# Patient Record
Sex: Female | Born: 1995 | Race: White | Hispanic: No | Marital: Married
Health system: Southern US, Community
[De-identification: ages and names within clinical notes are randomized; demographics above are authoritative.]

## PROBLEM LIST (undated history)

## (undated) DIAGNOSIS — F419 Anxiety disorder, unspecified: Secondary | ICD-10-CM

## (undated) DIAGNOSIS — K219 Gastro-esophageal reflux disease without esophagitis: Secondary | ICD-10-CM

---

## 2019-10-10 ENCOUNTER — Emergency Department (HOSPITAL_COMMUNITY): Payer: No Typology Code available for payment source

## 2019-10-10 ENCOUNTER — Other Ambulatory Visit: Payer: Self-pay

## 2019-10-10 ENCOUNTER — Encounter (HOSPITAL_COMMUNITY): Payer: Self-pay | Admitting: Emergency Medicine

## 2019-10-10 ENCOUNTER — Emergency Department (HOSPITAL_COMMUNITY)
Admission: EM | Admit: 2019-10-10 | Discharge: 2019-10-11 | Disposition: A | Discharge status: Home / Self Care | Payer: No Typology Code available for payment source | Attending: Emergency Medicine | Admitting: Emergency Medicine

## 2019-10-10 DIAGNOSIS — F419 Anxiety disorder, unspecified: Secondary | ICD-10-CM | POA: Diagnosis not present

## 2019-10-10 DIAGNOSIS — R110 Nausea: Secondary | ICD-10-CM | POA: Diagnosis not present

## 2019-10-10 DIAGNOSIS — R000 Tachycardia, unspecified: Secondary | ICD-10-CM | POA: Diagnosis not present

## 2019-10-10 HISTORY — DX: Anxiety disorder, unspecified: F41.9

## 2019-10-10 HISTORY — DX: Gastro-esophageal reflux disease without esophagitis: K21.9

## 2019-10-10 LAB — BASIC METABOLIC PANEL
Anion gap: 10 (ref 5–15)
BUN: 8 mg/dL (ref 6–20)
CO2: 23 mmol/L (ref 22–32)
Calcium: 9.7 mg/dL (ref 8.9–10.3)
Chloride: 104 mmol/L (ref 98–111)
Creatinine, Ser: 0.9 mg/dL (ref 0.44–1.00)
GFR calc Af Amer: >60 mL/min (ref >60)
GFR calc non Af Amer: >60 mL/min (ref >60)
Glucose, Bld: 146 mg/dL — ABNORMAL HIGH (ref 70–99)
Potassium: 3.2 mmol/L — ABNORMAL LOW (ref 3.5–5.1)
Sodium: 137 mmol/L (ref 135–145)

## 2019-10-10 LAB — I-STAT BETA HCG BLOOD, ED (MC, WL, AP ONLY): I-stat hCG, quantitative: <5 m[IU]/mL (ref <5)

## 2019-10-10 LAB — CBC
HCT: 41.2 % (ref 36.0–46.0)
Hemoglobin: 12.8 g/dL (ref 12.0–15.0)
MCH: 26.9 pg (ref 26.0–34.0)
MCHC: 31.1 g/dL (ref 30.0–36.0)
MCV: 86.7 fL (ref 80.0–100.0)
Platelets: 247 10*3/uL (ref 150–400)
RBC: 4.75 MIL/uL (ref 3.87–5.11)
RDW: 13.2 % (ref 11.5–15.5)
WBC: 7.6 10*3/uL (ref 4.0–10.5)
nRBC: 0 % (ref 0.0–0.2)

## 2019-10-10 LAB — TSH: TSH: 3.126 u[IU]/mL (ref 0.350–4.500)

## 2019-10-10 LAB — TROPONIN I (HIGH SENSITIVITY)
Troponin I (High Sensitivity): <2 ng/L (ref <18)
Troponin I (High Sensitivity): <2 ng/L (ref <18)

## 2019-10-10 LAB — D-DIMER, QUANTITATIVE: D-Dimer, Quant: 0.35 ug/mL-FEU (ref 0.00–0.50)

## 2019-10-10 MED ORDER — ONDANSETRON HCL 4 MG/2ML IJ SOLN
4.0000 mg | Freq: Once | INTRAMUSCULAR | Status: AC
  Administered 2019-10-10: 4 mg via INTRAVENOUS
  Filled 2019-10-10: qty 2

## 2019-10-10 MED ORDER — LORAZEPAM 2 MG/ML IJ SOLN
1.0000 mg | Freq: Once | INTRAMUSCULAR | Status: AC
  Administered 2019-10-10: 1 mg via INTRAVENOUS
  Filled 2019-10-10: qty 1

## 2019-10-10 NOTE — Discharge Instructions (Addendum)
Please contact the cardiology office listed in this paperwork and schedule an appointment to follow up with them.  As we discussed, avoid excessive caffeine and taking multiple antihistamine medications at the same time.  Ensure you are getting enough rest and staying hydrated.   Please return to the Emergency Department should you develop worsening or severe symptoms.

## 2019-10-10 NOTE — ED Provider Notes (Signed)
Medical screening examination/treatment/procedure(s) were conducted as a shared visit with non-physician practitioner(s) and myself.  I personally evaluated the patient during the encounter.  Clinical Impression:   Final diagnoses:  Sinus tachycardia  Anxiety   Patient is a very pleasant 24 year old female, history of anxiety, recently started Zoloft on Friday for anxiety, she reports that she has been on this medication in the past and did not have any side effects.  She reports that she has had some intermittent racing heart rate over the last couple of weeks but today seems to last a lot longer and is not going away.  She denies any swelling coughing or fevers.  She recently traveled to [place] 7 hours each direction by car, no history of blood clots, no history of cancer, no history of coronary disease or ischemia.  On exam the patient is tachycardic to 115 bpm and a sinus tachycardia, when she starts to talk or gets a nervous look in her eyes it goes up to 130 bpm.  She denies any significant alcohol intake and denies any drug use, denies any significant history of unexpected weight loss or weight gain.  She has not had any infectious symptoms.  Denies drugs of abuse or excessive caffeine intake.  She has been taking some antihistamines for an unexplained urticaria but only takes 1 pill every other day or so.  She did take 1 today.  EKG show what appears to be sinus tachycardia, she spontaneously improves, there is not appear to be an underlying toxic arrhythmia.  It is narrow complex, will give some medication to treat underlying anxiety, will need to have evaluation of electrolytes and thyroid function.  Patient is agreeable to the plan   EKG Interpretation  Date/Time:  Tuesday October 10 2019 20:25:36 EDT Ventricular Rate:  156 PR Interval:    QRS Duration: 74 QT Interval:  318 QTC Calculation: 512 R Axis:   61 Text Interpretation: Sinus tachycardia with short PR ST & T wave  abnormality, consider inferior ischemia Abnormal ECG No old tracing to compare Confirmed by Eber Hong ([id number]) on 10/10/2019 9:25:10 PM        EKG Interpretation  Date/Time:  Tuesday October 10 2019 21:08:33 EDT Ventricular Rate:  133 PR Interval:    QRS Duration: 88 QT Interval:  371 QTC Calculation: 552 R Axis:   70 Text Interpretation: Sinus tachycardia Abnormal Q suggests inferior infarct Repol abnrm suggests ischemia, diffuse leads Prolonged QT interval Since last tracing rate slower Confirmed by Eber Hong ([id number]) on 10/10/2019 9:26:55 PM          Eber Hong, MD 10/12/19 1501

## 2019-10-10 NOTE — ED Triage Notes (Signed)
Patient reports palpitations/central chest pain with mild SOB and nausea this evening , no cough or fever.

## 2019-10-10 NOTE — ED Provider Notes (Signed)
[facility] EMERGENCY DEPARTMENT Provider Note   CSN: [id number] Arrival date & time: 10/10/19  2016     History Chief Complaint  Patient presents with  . Chest Pain    Paula Nguyen is a 24 y.o. female with history of anxiety, presenting today with concern of 1.5 hours of rapid heart rate, with mild nausea and lightheadedness. She states she feels like she "just ran a race". She has a history of anxiety, currently on Zoloft which she started to Friday; she was previously on Zoloft and tolerated it without any side effects. She reports episodes of rapid heart rate in the past few weeks, each self resolving. She reports that today's episode is not improving on its own and is lasting longer than any previous episode.    She denies CP, SOB, fever, abdominal pain, cough. She recently traveled to [place] by care, 7 hours each way; no history of blood clots, cancer, heart disease. She is not on oral contraception or any other hormone replacement.   Finesse is otherwise well, she drinks alcohol socially, has never smoked or vaped, and has never participated in illicit drug use. She denies cold or heat intolerance, night sweats, unexpected weight gain or loss.   She does report that she is taking hydroxyzine for urticaria, as prescribed by her PCP, in addition to her daily antihistamine for seasonal allergies; her most recent dose of hydroxyzine was last night.   HPI     Past Medical History:  Diagnosis Date  . Anxiety   . GERD (gastroesophageal reflux disease)     There are no problems to display for this patient.    OB History   No obstetric history on file.     No family history on file.  Social History   Tobacco Use  . Smoking status: Not on file  Substance Use Topics  . Alcohol use: Not on file  . Drug use: Not on file    Home Medications Prior to Admission medications   Not on File    Allergies    Patient has no known allergies.  Review of  Systems   Review of Systems  Cardiovascular: Positive for palpitations.  Gastrointestinal: Positive for nausea. Negative for abdominal pain and vomiting.  Endocrine: Negative for cold intolerance and heat intolerance.  All other systems reviewed and are negative.   Physical Exam Updated Vital Signs BP 138/88 (BP Location: Right Arm)   Pulse (!) 160   Temp 97.9 F (36.6 C) (Oral)   Resp 20   Ht 5\' 5"  (1.651 m)   Wt 88 kg   LMP 09/26/2019   SpO2 100%   BMI 32.28 kg/m   Physical Exam Vitals and nursing note reviewed.  Constitutional:      General: She is not in acute distress. HENT:     Head: Normocephalic and atraumatic.  Eyes:     General: No scleral icterus.       Right eye: No discharge.        Left eye: No discharge.     Conjunctiva/sclera: Conjunctivae normal.  Cardiovascular:     Rate and Rhythm: Regular rhythm. Tachycardia present.     Heart sounds: Normal heart sounds. No murmur heard.  No friction rub. No gallop.   Pulmonary:     Effort: Pulmonary effort is normal. No tachypnea.     Breath sounds: No wheezing, rhonchi or rales.  Abdominal:     Palpations: Abdomen is soft.     Tenderness:  There is no abdominal tenderness. There is no guarding.  Skin:    General: Skin is warm and dry.  Neurological:     General: No focal deficit present.     Mental Status: She is alert.  Psychiatric:        Mood and Affect: Mood normal.     ED Results / Procedures / Treatments   Labs (all labs ordered are listed, but only abnormal results are displayed) Labs Reviewed  CBC  BASIC METABOLIC PANEL  D-DIMER, QUANTITATIVE (NOT AT [facility])  I-STAT BETA HCG BLOOD, ED ([facility], [facility], [facility] ONLY)  TROPONIN I (HIGH SENSITIVITY)    EKG EKG Interpretation  Date/Time:  Tuesday October 10 2019 21:08:33 EDT Ventricular Rate:  133 PR Interval:    QRS Duration: 88 QT Interval:  371 QTC Calculation: 552 R Axis:   70 Text Interpretation: Sinus tachycardia Abnormal Q suggests inferior  infarct Repol abnrm suggests ischemia, diffuse leads Prolonged QT interval Since last tracing rate slower Confirmed by Eber Hong ([id number]) on 10/10/2019 9:26:55 PM   Radiology DG Chest Portable 1 View  Result Date: 10/10/2019 CLINICAL DATA:  Chest pain with cardiac palpitations EXAM: PORTABLE CHEST 1 VIEW COMPARISON:  None. FINDINGS: Lungs are clear. Heart size and pulmonary vascularity are normal. No adenopathy. No pneumothorax. No bone lesions. IMPRESSION: No abnormality noted. Electronically Signed   By: Bretta Bang III M.D.   On: 10/10/2019 21:19    Procedures Procedures (including critical care time)  Medications Ordered in ED Medications - No data to display  ED Course  I have reviewed the triage vital signs and the nursing notes.  Pertinent labs & imaging results that were available during my care of the patient were reviewed by me and considered in my medical decision making (see chart for details).    MDM Rules/Calculators/A&P                         Paula Nguyen a 24 y.o female with history of anxiety presenting today with tachycardia. She has a history of similar episodes of fast heart rate usually self resolving.    ED work up included CBC and BMP not concerning. HCG negative for pregnancy, D-Dimer negative , troponin normal. TSH normal at 3.126.   CXR negative for abnormal finding, ECG with narrow complex tachycardia consistent with sinus rhythm, without concern for toxic arrhythmia.   At time of exam, tachycardia waxing and waning in accordance with visible worry on the patient's face - rate increasing with worry and decreasing as she became more comfortable.  Patient administered 4 mg of zofran for nausea and 1 mg ativan IV for anxiety with subsequent improvement of nausea and return to normal heart rate of 94 bmp at time of my reevaluation of the patient.   PE unlikely given waxing and waning nature of tachycardia, spontaneous resolution of tachycardia, and  negative D-dimer. Patient presentation inconsistent with ACS.    Given normal work up and resolution of tachycardia, I do not feel there is need for further work up.   Will provide contact information for cardiology office for follow up.   Patient instructed to avoid excessive caffeine, to ensure she is sleeping enough, staying hydrated, and to avoid taking both of her antihistamine medications together as this can stimulate the cardiac muscle and induce tachycardia.   She was given clear return precautions should she develop CP, SOB, unrelenting tachycardia, or onset or worsening severity of her symptoms.  She voiced understanding of the plan and each of her questions were answered to her expressed satisfaction.   Final Clinical Impression(s) / ED Diagnoses Final diagnoses:  None    Rx / DC Orders ED Discharge Orders    None       Sherrilee Gilles 10/11/19 0010    Eber Hong, MD 10/12/19 [phone]

## 2021-03-30 IMAGING — DX DG CHEST 1V PORT
1 series · 1 of 1 positions shown · non-contrast
Comparison: None.

CLINICAL DATA: Chest pain with cardiac palpitations

EXAM:
PORTABLE CHEST 1 VIEW

[chest ap]
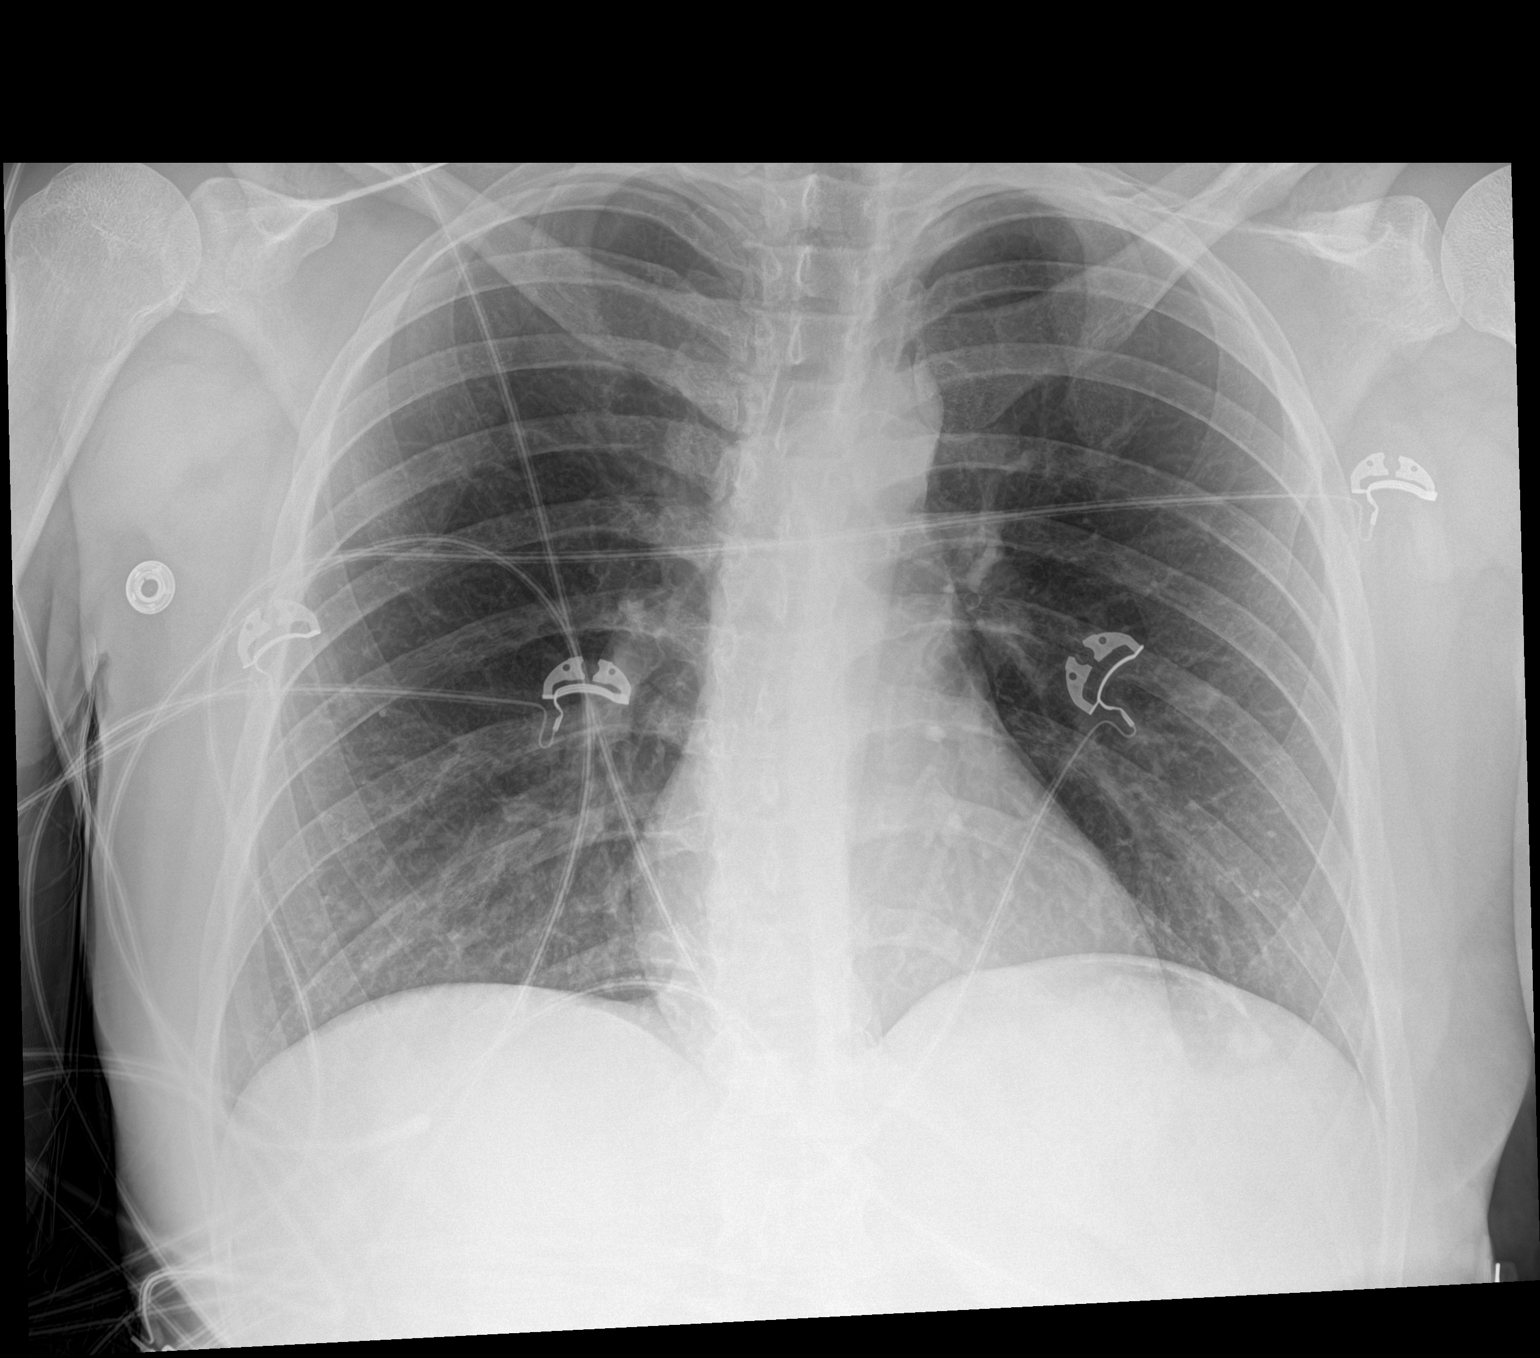

[1 of 1 positions shown; findings below may reference images not displayed]

FINDINGS: Lungs are clear. Heart size and pulmonary vascularity are normal. No
adenopathy. No pneumothorax. No bone lesions.
IMPRESSION: No abnormality noted.
